# Patient Record
Sex: Male | Born: 2005 | Race: Black or African American | Hispanic: No | Marital: Single | Smoking: Never smoker
Health system: Southern US, Community
[De-identification: ages and names within clinical notes are randomized; demographics above are authoritative.]

## PROBLEM LIST (undated history)

## (undated) DIAGNOSIS — E559 Vitamin D deficiency, unspecified: Secondary | ICD-10-CM

## (undated) DIAGNOSIS — R6250 Unspecified lack of expected normal physiological development in childhood: Secondary | ICD-10-CM

## (undated) DIAGNOSIS — F909 Attention-deficit hyperactivity disorder, unspecified type: Secondary | ICD-10-CM

## (undated) DIAGNOSIS — R630 Anorexia: Secondary | ICD-10-CM

## (undated) DIAGNOSIS — U071 COVID-19: Secondary | ICD-10-CM

## (undated) DIAGNOSIS — E049 Nontoxic goiter, unspecified: Secondary | ICD-10-CM

## (undated) HISTORY — DX: Attention-deficit hyperactivity disorder, unspecified type: F90.9

## (undated) HISTORY — DX: Unspecified lack of expected normal physiological development in childhood: R62.50

## (undated) HISTORY — DX: Nontoxic goiter, unspecified: E04.9

## (undated) HISTORY — DX: Vitamin D deficiency, unspecified: E55.9

## (undated) HISTORY — PX: CIRCUMCISION: SHX1350

## (undated) HISTORY — DX: Anorexia: R63.0

## (undated) NOTE — *Deleted (*Deleted)
Integrated Behavioral Health Follow Up Visit  MRN: [id number] Name: Randy [name]  Number of Integrated Behavioral Health Clinician visits: 1/6 Session Start time: ***  Session End time: *** Total time: {IBH Total Time:21014050}  Type of Service: Integrated Behavioral Health- Individual/Family Interpretor:{yes [id number]} Interpretor Name and Language: ***  SUBJECTIVE: Randy Isola Priceis a 14 y.o.maleaccompanied by Mother Patient was referred byDr. Meredeth Ide as part of ADHD pathway based on Mom's concerns about poor academic performance. Patient reports the following symptoms/concerns:Patient reports that he is not doing well in school but is not worried about academics. Patient reports that he does want to pull up his one F that he has right now so that he can be eligible to play basketball. Patient reports that he plans to drop out of school at 5 (to which Mom responded that he will no longer be able to live at home if he drops out). The Patient's Mom also expressed concerns about the Patient's friend group being "gangsters" and feels that he engaging in negative behavior at school to impress his friends.  Duration of problem:several years; Severity of problem:mild  OBJECTIVE: Mood:Angry andlack of progress in schooland Affect: Appropriate Risk of harm to self or others:Patient reports some recent suicidal ideations but states he does not intent to act on them and has no plan.   LIFE CONTEXT: Family and Social:Patient lives with his Mom two sisters and younger brother. Mom reports that the Patient often expresses that he wants more attention from Mom. The Patient reports that Mom talks to him frequently about financial stressors and his Dad. School/Work:Patient is in 9th grade at [employer] and is currently getting D's in all classes except one of them (he has an F). The Patient plans to try out for basketball next week at his school and looks forward to  being on the team. Self-Care:Patient likes playing basketball and play station. Life Changes:None Reported  GOALS ADDRESSED: Patient will: 1. Reduce symptoms YN:WGNFAOZHY andhyperactivity and difficulty focusing 2. Increase knowledge and/or ability QM:VHQION skills and healthy habits 3. Demonstrate ability to:Increase adequate support systems for patient/family and Increase motivation to adhere to plan of care  INTERVENTIONS: Interventions utilized:Motivational Interviewing, Solution-Focused Strategies and Supportive Counseling Standardized Assessments completed:Not Needed ASSESSMENT: Patient currently experiencing ***.   Patient may benefit from ***.  PLAN: 4. Follow up with behavioral health clinician on : *** 5. Behavioral recommendations: *** 6. Referral(s): {IBH Referrals:[id number]} 7. "From scale of 1-10, how likely are you to follow plan?": ***  Katheran Awe, [facility]

---
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
## 2023-02-08 DIAGNOSIS — E063 Autoimmune thyroiditis: Secondary | ICD-10-CM | POA: Diagnosis not present

## 2023-02-08 LAB — TSH: TSH: 3.11 (ref 0.41–5.90)

## 2023-03-04 DIAGNOSIS — Z7689 Persons encountering health services in other specified circumstances: Secondary | ICD-10-CM | POA: Diagnosis not present

## 2023-03-17 ENCOUNTER — Encounter: Payer: Self-pay | Admitting: *Deleted

## 2023-05-27 DIAGNOSIS — M2550 Pain in unspecified joint: Secondary | ICD-10-CM | POA: Diagnosis not present

## 2023-05-27 DIAGNOSIS — M546 Pain in thoracic spine: Secondary | ICD-10-CM | POA: Diagnosis not present

## 2023-05-27 DIAGNOSIS — R768 Other specified abnormal immunological findings in serum: Secondary | ICD-10-CM | POA: Diagnosis not present

## 2023-05-27 DIAGNOSIS — M549 Dorsalgia, unspecified: Secondary | ICD-10-CM | POA: Diagnosis not present

## 2023-05-30 DIAGNOSIS — J029 Acute pharyngitis, unspecified: Secondary | ICD-10-CM | POA: Diagnosis not present

## 2023-05-30 DIAGNOSIS — R768 Other specified abnormal immunological findings in serum: Secondary | ICD-10-CM | POA: Diagnosis not present

## 2023-05-30 DIAGNOSIS — Z09 Encounter for follow-up examination after completed treatment for conditions other than malignant neoplasm: Secondary | ICD-10-CM | POA: Diagnosis not present

## 2023-07-26 DIAGNOSIS — M2550 Pain in unspecified joint: Secondary | ICD-10-CM | POA: Diagnosis not present

## 2023-07-26 DIAGNOSIS — M41124 Adolescent idiopathic scoliosis, thoracic region: Secondary | ICD-10-CM | POA: Diagnosis not present

## 2023-07-26 DIAGNOSIS — R768 Other specified abnormal immunological findings in serum: Secondary | ICD-10-CM | POA: Diagnosis not present

## 2023-08-01 DIAGNOSIS — R5383 Other fatigue: Secondary | ICD-10-CM | POA: Diagnosis not present

## 2023-08-01 DIAGNOSIS — E039 Hypothyroidism, unspecified: Secondary | ICD-10-CM | POA: Diagnosis not present

## 2023-08-01 DIAGNOSIS — M542 Cervicalgia: Secondary | ICD-10-CM | POA: Diagnosis not present

## 2023-08-01 DIAGNOSIS — E559 Vitamin D deficiency, unspecified: Secondary | ICD-10-CM | POA: Diagnosis not present

## 2023-08-11 DIAGNOSIS — M25562 Pain in left knee: Secondary | ICD-10-CM | POA: Diagnosis not present

## 2023-08-15 ENCOUNTER — Emergency Department (HOSPITAL_COMMUNITY)
Admission: EM | Admit: 2023-08-15 | Discharge: 2023-08-15 | Discharge status: Against Medical Advice | Payer: Medicaid Other | Source: Home / Self Care

## 2023-08-15 DIAGNOSIS — M549 Dorsalgia, unspecified: Secondary | ICD-10-CM | POA: Diagnosis not present

## 2023-08-26 NOTE — Patient Instructions (Signed)

## 2023-08-29 ENCOUNTER — Encounter: Payer: Self-pay | Admitting: Nurse Practitioner

## 2023-08-29 ENCOUNTER — Ambulatory Visit (INDEPENDENT_AMBULATORY_CARE_PROVIDER_SITE_OTHER): Payer: Medicaid Other | Admitting: Nurse Practitioner

## 2023-08-29 VITALS — BP 116/70 | HR 98 | Ht 67.0 in | Wt 123.0 lb

## 2023-08-29 DIAGNOSIS — E039 Hypothyroidism, unspecified: Secondary | ICD-10-CM | POA: Diagnosis not present

## 2023-08-29 NOTE — Progress Notes (Signed)
 Endocrinology Consult Note                                         08/29/2023, 1:54 PM  Subjective:   Subjective    Randy [name] is a 18 y.o.-year-old male patient being seen in consultation for hypothyroidism referred by Tommie Sams, DO.   Past Medical History:  Diagnosis Date   ADHD (attention deficit hyperactivity disorder)    COVID    Goiter    Physical growth delay    Poor appetite    Vitamin D deficiency disease     Past Surgical History:  Procedure Laterality Date   CIRCUMCISION     at birth    Social History   Socioeconomic History   Marital status: Single    Spouse name: Not on file   Number of children: Not on file   Years of education: Not on file   Highest education level: Not on file  Occupational History   Not on file  Tobacco Use   Smoking status: Never    Passive exposure: Yes   Smokeless tobacco: Never  Substance and Sexual Activity   Alcohol use: No    Alcohol/week: 0.0 standard drinks of alcohol   Drug use: No   Sexual activity: Never  Other Topics Concern   Not on file  Social History Narrative   Randy [name] lives with parents, 1 sister, 1 brother Jerilynn Som)       Social Drivers of Corporate investment banker Strain: Not on file  Food Insecurity: Unknown (07/26/2023)   Received from [facility]   Hunger Vital Sign    Worried About Running Out of Food in the Last Year: Not on file    Ran Out of Food in the Last Year: Never true  Transportation Needs: No Transportation Needs (07/26/2023)   Received from [facility] - Transportation    In the past 12 months, has lack of transportation kept you from medical appointments or from getting medications?: No    Lack of Transportation (Non-Medical): No  Physical Activity: Not on file  Stress: Not on file  Social Connections: Not on file    Family History  Problem Relation Age of  Onset   Mental illness Mother        Bipolar/depression   Bipolar disorder Mother    Mental illness Father    Mental illness Sister    Cancer Maternal Grandfather        Stomach Cancer   Thyroid disease Neg Hx     Outpatient Encounter Medications as of 08/29/2023  Medication Sig   ibuprofen (ADVIL) 200 MG tablet Take 200 mg by mouth as needed.   levothyroxine (SYNTHROID) 25 MCG tablet Take 25 mcg by mouth every morning.   No facility-administered encounter medications on file as of 08/29/2023.    ALLERGIES: No Known Allergies VACCINATION STATUS: Immunization History  Administered Date(s) Administered   DTaP 02/18/2004, 04/20/2004, 06/22/2004, 03/18/2005, 02/26/2008  HIB (PRP-OMP) 02/18/2004, 04/20/2004, 06/22/2004, 06/22/2005   HPV 9-valent 02/03/2021   Hepatitis A 06/22/2005, 12/20/2005   Hepatitis B 02/18/2004, 04/20/2004, 06/22/2004   Hepatitis B, PED/ADOLESCENT 27[birth year]Sep[birth year]2003   IPV 02/18/2004, 04/20/2004, 06/22/2004, 03/18/2005   MMR 12/17/2004, 02/25/2005   MenQuadfi_Meningococcal Groups ACYW Conjugate 12/30/2020   Meningococcal B, OMV 12/30/2020, 02/03/2021   Meningococcal Conjugate 02/14/2015   PFIZER(Purple Top)SARS-COV-2 Vaccination 11/26/2019, 12/17/2019   Pneumococcal Conjugate-13 02/18/2004, 04/20/2004, 06/22/2004, 06/22/2005   Tdap 02/14/2015   Varicella 12/17/2004, 02/26/2008     HPI   Randy [name]  is a patient with the above medical history. he was diagnosed with hypothyroidism at approximate age of 16 years, which required subsequent initiation of thyroid hormone replacement therapy. he was given low dose Levothyroxine 25 mcg back in June of 2024.  He reports initially he did have some relief in his symptoms but then they started to creep back in.  He notes he stopped the medication for approx 2 months, just restarted taking it about 2 weeks ago.  He notes when he stopped the medication, his joint aches got significantly worse.  I reviewed patient's thyroid  tests:  Lab Results  Component Value Date   TSH 3.11 02/08/2023   TSH 7.51 (A) 12/08/2022   TSH 3.384 07/28/2015   TSH 1.629 05/22/2014   TSH 4.163 02/05/2013   TSH 4.114 08/09/2012   TSH 4.647 10/19/2011   TSH 1.524 03/24/2011   FREET4 0.83 07/28/2015   FREET4 1.27 02/05/2013   FREET4 1.23 08/09/2012   FREET4 1.19 10/19/2011   FREET4 1.19 03/24/2011     Pt describes: - fatigue - heat intolerance - constipation - joint aches  Pt denies feeling nodules in neck, hoarseness, dysphagia/odynophagia, SOB with lying down.  he denies any known family history of thyroid disorders, No family history of thyroid cancer.  No history of radiation therapy to head or neck.  No recent use of iodine supplements.  Denies use of Biotin containing supplements.  I reviewed his chart and he also has a history of ADHD, migraines, vitamin D deficiency, scoliosis.   ROS:  Constitutional: no weight gain/loss, + fatigue, + subjective hyperthermia, no subjective hypothermia Eyes: no blurry vision, no xerophthalmia ENT: no sore throat, no nodules palpated in throat, no dysphagia/odynophagia, no hoarseness Cardiovascular: no chest pain, no SOB, no palpitations, no leg swelling Respiratory: no cough, no SOB Gastrointestinal: no nausea/vomiting/diarrhea, + constipation Musculoskeletal: + diffuse muscle/joint aches Skin: no rashes Neurological: no tremors, no numbness, no tingling, no dizziness Psychiatric: no depression, no anxiety   Objective:   Objective     BP 116/70 (BP Location: Left Arm, Patient Position: Sitting, Cuff Size: Large)   Pulse 98   Ht 5\' 7"  (1.702 m)   Wt 123 lb (55.8 kg)   BMI 19.26 kg/m  Wt Readings from Last 3 Encounters:  08/29/23 123 lb (55.8 kg) (5%, Z= -1.61)*  08/26/22 127 lb (57.6 kg) (12%, Z= -1.20)*  05/24/21 124 lb 9.6 oz (56.5 kg) (15%, Z= -1.02)*   * Growth percentiles are based on CDC (Boys, 2-20 Years) data.    BP Readings from Last 3 Encounters:   08/29/23 116/70  08/26/22 120/75  11/30/21 (!) 130/81     Constitutional:  Body mass index is 19.26 kg/m., not in acute distress, normal state of mind Eyes: PERRLA, EOMI, no exophthalmos ENT: moist mucous membranes, mild thyromegaly, no cervical lymphadenopathy Cardiovascular: normal precordial activity, RRR, + murmur, no rubs/gallops Respiratory:  adequate breathing efforts, no gross chest  deformity, Clear to auscultation bilaterally Gastrointestinal: abdomen soft, non-tender, no distension, bowel sounds present Musculoskeletal: no gross deformities, strength intact in all four extremities Skin: moist, warm, no rashes Neurological: + slight tremor with outstretched hands, deep tendon reflexes normal in BLE.   CMP ( most recent) CMP     Component Value Date/Time   NA 140 04/15/2021 1607   K 3.8 04/15/2021 1607   CL 105 04/15/2021 1607   CO2 28 04/15/2021 1607   GLUCOSE 83 04/15/2021 1607   GLUCOSE 89 09/26/2015 1143   BUN 11 04/15/2021 1607   CREATININE 0.91 04/15/2021 1607   CREATININE 0.57 05/22/2014 1558   CALCIUM 9.1 04/15/2021 1607   PROT 7.1 05/22/2014 1558   ALBUMIN 4.3 05/22/2014 1558   AST 26 05/22/2014 1558   ALT 11 05/22/2014 1558   ALKPHOS 340 05/22/2014 1558   BILITOT 0.4 05/22/2014 1558   GFRNONAA NOT CALCULATED 04/15/2021 1607     Diabetic Labs (most recent): No results found for: "HGBA1C", "MICROALBUR"   Lipid Panel ( most recent) Lipid Panel  No results found for: "CHOL", "TRIG", "HDL", "CHOLHDL", "VLDL", "LDLCALC", "LDLDIRECT", "LABVLDL"     Lab Results  Component Value Date   TSH 3.11 02/08/2023   TSH 7.51 (A) 12/08/2022   TSH 3.384 07/28/2015   TSH 1.629 05/22/2014   TSH 4.163 02/05/2013   TSH 4.114 08/09/2012   TSH 4.647 10/19/2011   TSH 1.524 03/24/2011   FREET4 0.83 07/28/2015   FREET4 1.27 02/05/2013   FREET4 1.23 08/09/2012   FREET4 1.19 10/19/2011   FREET4 1.19 03/24/2011      Assessment & Plan:   ASSESSMENT /  PLAN:  1. Hypothyroidism-unspecified   Patient with long-standing hypothyroidism, on levothyroxine therapy. On physical exam , patient  does not  have  gross goiter, thyroid nodules, or neck compression symptoms.  He is advised to continue Levothyroxine 25 mcg po daily before breakfast but to start taking it consistently for at least 6 weeks before we recheck labs to adjust dose.  - We discussed about correct intake of levothyroxine, at fasting, with water, separated by at least 30 minutes from breakfast, and separated by more than 4 hours from calcium, iron, multivitamins, acid reflux medications (PPIs). -Patient is made aware of the fact that thyroid hormone replacement is needed for life, dose to be adjusted by periodic monitoring of thyroid function tests.  - Will check thyroid tests before next visit: TSH, free T4, and thyroid antibodies to help classify dysfunction.  -Due to absence of clinical goiter, no need for thyroid ultrasound.  - Time spent with the patient: 45 minutes, of which >50% was spent in obtaining information about his symptoms, reviewing his previous labs, evaluations, and treatments, counseling him about his hypothyroidism, and developing a plan to confirm the diagnosis and long term treatment as necessary. Please refer to "Patient Self Inventory" in the Media tab for reviewed elements of pertinent patient history.  Shelva Majestic participated in the discussions, expressed understanding, and voiced agreement with the above plans.  All questions were answered to his satisfaction. he is encouraged to contact clinic should he have any questions or concerns prior to his return visit.   FOLLOW UP PLAN:  Return in about 6 weeks (around 10/10/2023) for Thyroid follow up, Previsit labs.  Ronny Bacon, [facility] [address] [place], [place] [address] Phone: [phone] Fax: [phone]  08/29/2023, 1:54 PM

## 2023-08-31 DIAGNOSIS — M329 Systemic lupus erythematosus, unspecified: Secondary | ICD-10-CM | POA: Diagnosis not present

## 2023-08-31 DIAGNOSIS — Z79899 Other long term (current) drug therapy: Secondary | ICD-10-CM | POA: Diagnosis not present

## 2023-09-19 DIAGNOSIS — J029 Acute pharyngitis, unspecified: Secondary | ICD-10-CM | POA: Diagnosis not present

## 2023-09-19 DIAGNOSIS — R030 Elevated blood-pressure reading, without diagnosis of hypertension: Secondary | ICD-10-CM | POA: Diagnosis not present

## 2023-10-12 LAB — TSH: TSH: 6.43 u[IU]/mL — ABNORMAL HIGH (ref 0.450–4.500)

## 2023-10-12 LAB — THYROGLOBULIN ANTIBODY: Thyroglobulin Antibody: <1 [IU]/mL (ref 0.0–0.9)

## 2023-10-12 LAB — T4, FREE: Free T4: 1.41 ng/dL (ref 0.93–1.60)

## 2023-10-12 LAB — THYROID PEROXIDASE ANTIBODY: Thyroperoxidase Ab SerPl-aCnc: 19 [IU]/mL (ref 0–26)

## 2023-10-19 NOTE — Patient Instructions (Signed)

## 2023-10-20 ENCOUNTER — Ambulatory Visit: Payer: Medicaid Other | Admitting: Nurse Practitioner

## 2023-10-20 ENCOUNTER — Encounter: Payer: Self-pay | Admitting: Nurse Practitioner

## 2023-10-20 VITALS — BP 116/80 | HR 99 | Ht 67.0 in | Wt 117.6 lb

## 2023-10-20 DIAGNOSIS — E039 Hypothyroidism, unspecified: Secondary | ICD-10-CM | POA: Diagnosis not present

## 2023-10-20 MED ORDER — LEVOTHYROXINE SODIUM 25 MCG PO TABS: 25.0000 ug | ORAL_TABLET | Freq: Every day | ORAL | 1 refills | Status: DC

## 2023-10-20 NOTE — Progress Notes (Signed)
 Endocrinology Follow Up Note                                         10/20/2023, 11:02 AM  Subjective:   Subjective    Randy [name] is a 18 y.o.-year-old male patient being seen in follow up after being seen in consultation for hypothyroidism referred by Cook, Jayce G, DO.   Past Medical History:  Diagnosis Date   ADHD (attention deficit hyperactivity disorder)    COVID    Goiter    Physical growth delay    Poor appetite    Vitamin D deficiency disease     Past Surgical History:  Procedure Laterality Date   CIRCUMCISION     at birth    Social History   Socioeconomic History   Marital status: Single    Spouse name: Not on file   Number of children: Not on file   Years of education: Not on file   Highest education level: Not on file  Occupational History   Not on file  Tobacco Use   Smoking status: Never    Passive exposure: Yes   Smokeless tobacco: Never  Substance and Sexual Activity   Alcohol use: No    Alcohol/week: 0.0 standard drinks of alcohol   Drug use: No   Sexual activity: Never  Other Topics Concern   Not on file  Social History Narrative   Amarius lives with parents, 1 sister, 1 brother Moishe Angel)       Social Drivers of Corporate investment banker Strain: Not on file  Food Insecurity: Unknown (07/26/2023)   Received from [facility]   Hunger Vital Sign    Worried About Running Out of Food in the Last Year: Not on file    Ran Out of Food in the Last Year: Never true  Transportation Needs: No Transportation Needs (07/26/2023)   Received from [facility] - Transportation    In the past 12 months, has lack of transportation kept you from medical appointments or from getting medications?: No    Lack of Transportation (Non-Medical): No  Physical Activity: Not on file  Stress: Not on file  Social Connections: Not on file    Family  History  Problem Relation Age of Onset   Mental illness Mother        Bipolar/depression   Bipolar disorder Mother    Mental illness Father    Mental illness Sister    Cancer Maternal Grandfather        Stomach Cancer   Thyroid disease Neg Hx     Outpatient Encounter Medications as of 10/20/2023  Medication Sig   hydroxychloroquine (PLAQUENIL) 200 MG tablet Take 200 mg by mouth daily.   ibuprofen (ADVIL) 200 MG tablet Take 200 mg by mouth as needed.   [DISCONTINUED] levothyroxine (SYNTHROID) 25 MCG tablet Take 25 mcg by mouth every morning.   levothyroxine (SYNTHROID) 25 MCG tablet Take 1 tablet (25 mcg total) by  mouth daily before breakfast.   No facility-administered encounter medications on file as of 10/20/2023.    ALLERGIES: No Known Allergies VACCINATION STATUS: Immunization History  Administered Date(s) Administered   DTaP 02/18/2004, 04/20/2004, 06/22/2004, 03/18/2005, 02/26/2008   HIB (PRP-OMP) 02/18/2004, 04/20/2004, 06/22/2004, 06/22/2005   HPV 9-valent 02/03/2021   Hepatitis A 06/22/2005, 12/20/2005   Hepatitis B 02/18/2004, 04/20/2004, 06/22/2004   Hepatitis B, PED/ADOLESCENT 11-17-03   IPV 02/18/2004, 04/20/2004, 06/22/2004, 03/18/2005   MMR 12/17/2004, 02/25/2005   MenQuadfi_Meningococcal Groups ACYW Conjugate 12/30/2020   Meningococcal B, OMV 12/30/2020, 02/03/2021   Meningococcal Conjugate 02/14/2015   PFIZER(Purple Top)SARS-COV-2 Vaccination 11/26/2019, 12/17/2019   Pneumococcal Conjugate-13 02/18/2004, 04/20/2004, 06/22/2004, 06/22/2005   Tdap 02/14/2015   Varicella 12/17/2004, 02/26/2008     HPI   Randy [name]  is a patient with the above medical history. he was diagnosed with hypothyroidism at approximate age of 16 years, which required subsequent initiation of thyroid hormone replacement therapy. he was given low dose Levothyroxine 25 mcg back in June of 2024.  He reports initially he did have some relief in his symptoms but then they started to  creep back in.  He notes he stopped the medication for approx 2 months, just restarted taking it about 2 weeks ago.  He notes when he stopped the medication, his joint aches got significantly worse.  I reviewed patient's thyroid tests:  Lab Results  Component Value Date   TSH 6.430 (H) 10/11/2023   TSH 3.11 02/08/2023   TSH 7.51 (A) 12/08/2022   TSH 3.384 07/28/2015   TSH 1.629 05/22/2014   TSH 4.163 02/05/2013   TSH 4.114 08/09/2012   TSH 4.647 10/19/2011   TSH 1.524 03/24/2011   FREET4 1.41 10/11/2023   FREET4 0.83 07/28/2015   FREET4 1.27 02/05/2013   FREET4 1.23 08/09/2012   FREET4 1.19 10/19/2011   FREET4 1.19 03/24/2011    Pt denies feeling nodules in neck, hoarseness, dysphagia/odynophagia, SOB with lying down.  he denies any known family history of thyroid disorders, No family history of thyroid cancer.  No history of radiation therapy to head or neck.  No recent use of iodine supplements.  Denies use of Biotin containing supplements.  I reviewed his chart and he also has a history of ADHD, migraines, vitamin D deficiency, scoliosis.   ROS:  Constitutional: no weight gain/loss, + fatigue-improving, + subjective hyperthermia-resolved, no subjective hypothermia Eyes: no blurry vision, no xerophthalmia ENT: no sore throat, no nodules palpated in throat, no dysphagia/odynophagia, no hoarseness Cardiovascular: no chest pain, no SOB, no palpitations, no leg swelling Respiratory: no cough, no SOB Gastrointestinal: no nausea/vomiting/diarrhea, + constipation-improving Musculoskeletal: + diffuse muscle/joint aches-improving Skin: no rashes Neurological: no tremors, no numbness, no tingling, no dizziness Psychiatric: no depression, no anxiety   Objective:   Objective     BP 116/80 (BP Location: Left Arm, Patient Position: Sitting, Cuff Size: Large)   Pulse 99   Ht 5\' 7"  (1.702 m)   Wt 117 lb 9.6 oz (53.3 kg)   BMI 18.42 kg/m  Wt Readings from Last 3 Encounters:   10/20/23 117 lb 9.6 oz (53.3 kg) (2%, Z= -1.97)*  08/29/23 123 lb (55.8 kg) (5%, Z= -1.61)*  08/26/22 127 lb (57.6 kg) (12%, Z= -1.20)*   * Growth percentiles are based on CDC (Boys, 2-20 Years) data.    BP Readings from Last 3 Encounters:  10/20/23 116/80  08/29/23 116/70  08/26/22 120/75      Physical Exam- Limited  Constitutional:  Body mass  index is 18.42 kg/m. , not in acute distress, normal state of mind Eyes:  EOMI, no exophthalmos Musculoskeletal: no gross deformities, strength intact in all four extremities, no gross restriction of joint movements Skin:  no rashes, no hyperemia Neurological: + slight tremor with outstretched hands   CMP ( most recent) CMP     Component Value Date/Time   NA 140 04/15/2021 1607   K 3.8 04/15/2021 1607   CL 105 04/15/2021 1607   CO2 28 04/15/2021 1607   GLUCOSE 83 04/15/2021 1607   GLUCOSE 89 09/26/2015 1143   BUN 11 04/15/2021 1607   CREATININE 0.91 04/15/2021 1607   CREATININE 0.57 05/22/2014 1558   CALCIUM 9.1 04/15/2021 1607   PROT 7.1 05/22/2014 1558   ALBUMIN 4.3 05/22/2014 1558   AST 26 05/22/2014 1558   ALT 11 05/22/2014 1558   ALKPHOS 340 05/22/2014 1558   BILITOT 0.4 05/22/2014 1558   GFRNONAA NOT CALCULATED 04/15/2021 1607     Diabetic Labs (most recent): No results found for: "HGBA1C", "MICROALBUR"   Lipid Panel ( most recent) Lipid Panel  No results found for: "CHOL", "TRIG", "HDL", "CHOLHDL", "VLDL", "LDLCALC", "LDLDIRECT", "LABVLDL"     Lab Results  Component Value Date   TSH 6.430 (H) 10/11/2023   TSH 3.11 02/08/2023   TSH 7.51 (A) 12/08/2022   TSH 3.384 07/28/2015   TSH 1.629 05/22/2014   TSH 4.163 02/05/2013   TSH 4.114 08/09/2012   TSH 4.647 10/19/2011   TSH 1.524 03/24/2011   FREET4 1.41 10/11/2023   FREET4 0.83 07/28/2015   FREET4 1.27 02/05/2013   FREET4 1.23 08/09/2012   FREET4 1.19 10/19/2011   FREET4 1.19 03/24/2011      Assessment & Plan:   ASSESSMENT / PLAN:  1.  Hypothyroidism-acquired  Patient with long-standing hypothyroidism, on levothyroxine therapy. On physical exam, patient does not have gross goiter, thyroid nodules, or neck compression symptoms.  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  He is advised to continue Levothyroxine 25 mcg po daily before breakfast.  TSH is not quite at goal but his Free T4 is on the mid to upper end of normal, for which he may not tolerate a dosage increase.  - We discussed about correct intake of levothyroxine, at fasting, with water, separated by at least 30 minutes from breakfast, and separated by more than 4 hours from calcium, iron, multivitamins, acid reflux medications (PPIs). -Patient is made aware of the fact that thyroid hormone replacement is needed for life, dose to be adjusted by periodic monitoring of thyroid function tests.  Will recheck TFTs prior to next visit and adjust dose accordingly.   I spent  24  minutes in the care of the patient today including review of labs from Thyroid Function, CMP, and other relevant labs ; imaging/biopsy records (current and previous including abstractions from other facilities); face-to-face time discussing  his lab results and symptoms, medications doses, his options of short and long term treatment based on the latest standards of care / guidelines;   and documenting the encounter.  Randy [name]  participated in the discussions, expressed understanding, and voiced agreement with the above plans.  All questions were answered to his satisfaction. he is encouraged to contact clinic should he have any questions or concerns prior to his return visit.   FOLLOW UP PLAN:  Return in about 3 months (around 01/19/2024) for Thyroid follow up, Previsit  labs.  Randy [name], [facility] [address] [place], [place] [address] Phone: [phone] Fax: [phone]  10/20/2023, 11:02 AM

## 2023-10-26 DIAGNOSIS — K0262 Dental caries on smooth surface penetrating into dentin: Secondary | ICD-10-CM | POA: Diagnosis not present

## 2023-10-26 DIAGNOSIS — Z0121 Encounter for dental examination and cleaning with abnormal findings: Secondary | ICD-10-CM | POA: Diagnosis not present

## 2023-12-28 DIAGNOSIS — M25561 Pain in right knee: Secondary | ICD-10-CM | POA: Diagnosis not present

## 2024-01-17 DIAGNOSIS — E039 Hypothyroidism, unspecified: Secondary | ICD-10-CM | POA: Diagnosis not present

## 2024-01-18 DIAGNOSIS — Z79899 Other long term (current) drug therapy: Secondary | ICD-10-CM | POA: Diagnosis not present

## 2024-01-18 DIAGNOSIS — M329 Systemic lupus erythematosus, unspecified: Secondary | ICD-10-CM | POA: Diagnosis not present

## 2024-01-18 LAB — TSH: TSH: 5.53 u[IU]/mL — ABNORMAL HIGH (ref 0.450–4.500)

## 2024-01-18 LAB — T4, FREE: Free T4: 1.32 ng/dL (ref 0.82–1.77)

## 2024-01-19 ENCOUNTER — Encounter: Payer: Self-pay | Admitting: Internal Medicine

## 2024-01-19 ENCOUNTER — Ambulatory Visit: Attending: Internal Medicine | Admitting: Internal Medicine

## 2024-01-19 VITALS — BP 122/84 | HR 73 | Ht 67.0 in | Wt 119.0 lb

## 2024-01-19 DIAGNOSIS — R011 Cardiac murmur, unspecified: Secondary | ICD-10-CM | POA: Diagnosis not present

## 2024-01-19 NOTE — Progress Notes (Signed)
 Cardiology Office Note   Date:  01/19/2024   ID:  Randy [name], DOB 06-Oct-2005, MRN [id number]  PCP:  Bluford Jacqulyn MATSU, DO  Cardiologist:   Vina Gull, MD    Pt referred for evaluation of a heart murmur   History of Present Illness: Randy [name] is a 18 y.o. male with a history of lupus  Followed at [facility] Recently seen in clinic and a heart murmur was appreciated  Referred to cardiology   The pt denies CP   Breathing is good   He is very active   Runs  He denies palpitations   No dizziness   Current Meds  Medication Sig   hydroxychloroquine (PLAQUENIL) 200 MG tablet Take 200 mg by mouth daily.   ibuprofen  (ADVIL ) 200 MG tablet Take 200 mg by mouth as needed.   levothyroxine  (SYNTHROID ) 25 MCG tablet Take 1 tablet (25 mcg total) by mouth daily before breakfast.     Past Medical History:  Diagnosis Date   ADHD (attention deficit hyperactivity disorder)    COVID    Goiter    Physical growth delay    Poor appetite    Vitamin D  deficiency disease     Past Surgical History:  Procedure Laterality Date   CIRCUMCISION     at birth     Social History:  The patient  reports that he has never smoked. He has been exposed to tobacco smoke. He has never used smokeless tobacco. He reports that he does not drink alcohol and does not use drugs.   Family History:  The patient's family history includes Bipolar disorder in his mother; Cancer in his maternal grandfather; Mental illness in his father, mother, and sister.    ROS:  Please see the history of present illness. All other systems are reviewed and  Negative to the above problem except as noted.    PHYSICAL EXAM: VS:  BP 122/84 (BP Location: Right Arm, Patient Position: Sitting)   Pulse 73   Ht 5' 7 (1.702 m)   Wt 119 lb (54 kg)   SpO2 98%   BMI 18.64 kg/m   GEN: Well nourished, well developed, in no acute distress  HEENT: normal  Neck: no JVD, carotid bruits Cardiac: RRR;  Gr I/VI systolic murmur base that went  away with sitting   ? Grade I/VI systlic murmur apex    Respiratory:  clear to auscultation  GI: soft, nontender, nondistended, No hepatomegaly  Ext  No LE edema  2+ PT pulses  Skin: warm and dry, no rash Neuro:  Strength and sensation are intact Psych: euthymic mood, full affect   EKG:  EKG is ordered today.  SR   ST changes consistent with early repolarization   Lipid Panel No results found for: CHOL, TRIG, HDL, CHOLHDL, VLDL, LDLCALC, LDLDIRECT    Wt Readings from Last 3 Encounters:  01/19/24 119 lb (54 kg)  10/20/23 117 lb 9.6 oz (53.3 kg) (2%, Z= -1.97)*  08/29/23 123 lb (55.8 kg) (5%, Z= -1.61)*   * Growth percentiles are based on CDC (Boys, 2-20 Years) data.      ASSESSMENT AND PLAN:  1  Murmur  Pt has a very soft systolic murmur at the base of heart that disappears with sitting    Consistent with Stills type murmur   He is very thin, young     No other pathologic murmurs appreciated      2  HCM   Reviewed diet   Avoid  sugar sweetened beverages, ultraprocessed foods   Recomm:  Veggies, lean proteins, water, unsweetened beverages   Will be available as needed    Current medicines are reviewed at length with the patient today.  The patient does not have concerns regarding medicines.  Signed, Vina Gull, MD  01/19/2024 9:31 AM    [facility] [address] [place], [place], [place]  [address] Phone: [phone]; Fax: [phone]

## 2024-01-19 NOTE — Patient Instructions (Signed)
Medication Instructions:  Continue all current medications.  Labwork: none  Testing/Procedures: none  Follow-Up: As needed.    Any Other Special Instructions Will Be Listed Below (If Applicable).  If you need a refill on your cardiac medications before your next appointment, please call your pharmacy.  

## 2024-01-20 DIAGNOSIS — M25562 Pain in left knee: Secondary | ICD-10-CM | POA: Diagnosis not present

## 2024-01-24 NOTE — Patient Instructions (Signed)

## 2024-01-25 ENCOUNTER — Encounter: Payer: Self-pay | Admitting: Nurse Practitioner

## 2024-01-25 ENCOUNTER — Ambulatory Visit (INDEPENDENT_AMBULATORY_CARE_PROVIDER_SITE_OTHER): Admitting: Nurse Practitioner

## 2024-01-25 VITALS — BP 102/62 | HR 90 | Ht 67.0 in | Wt 117.8 lb

## 2024-01-25 DIAGNOSIS — E039 Hypothyroidism, unspecified: Secondary | ICD-10-CM

## 2024-01-25 MED ORDER — LEVOTHYROXINE SODIUM 25 MCG PO TABS: 25.0000 ug | ORAL_TABLET | Freq: Every day | ORAL | 1 refills | Status: AC

## 2024-01-25 NOTE — Progress Notes (Signed)
 Endocrinology Follow Up Note                                         01/25/2024, 11:17 AM  Subjective:   Subjective    Randy [name] is a 18 y.o.-year-old male patient being seen in follow up after being seen in consultation for hypothyroidism referred by [name], Randy G, DO.   Past Medical History:  Diagnosis Date   ADHD (attention deficit hyperactivity disorder)    COVID    Goiter    Physical growth delay    Poor appetite    Vitamin D  deficiency disease     Past Surgical History:  Procedure Laterality Date   CIRCUMCISION     at birth    Social History   Socioeconomic History   Marital status: Single    Spouse name: Not on file   Number of children: Not on file   Years of education: Not on file   Highest education level: Not on file  Occupational History   Not on file  Tobacco Use   Smoking status: Never    Passive exposure: Yes   Smokeless tobacco: Never  Substance and Sexual Activity   Alcohol use: No    Alcohol/week: 0.0 standard drinks of alcohol   Drug use: No   Sexual activity: Never  Other Topics Concern   Not on file  Social History Narrative   Randy [name] lives with parents, 1 sister, 1 brother Randy [name])       Social Drivers of Corporate investment banker Strain: Not on file  Food Insecurity: Unknown (07/26/2023)   Received from [facility]   Hunger Vital Sign    Worried About Running Out of Food in the Last Year: Not on file    Within the past 12 months, the food you bought just didn't last and you didn't have money to get more.: Never true  Transportation Needs: No Transportation Needs (07/26/2023)   Received from [facility]    In the past 12 months, has lack of transportation kept you from medical appointments or from getting medications?: No    Lack of Transportation (Non-Medical): No  Physical Activity: Not on file   Stress: Not on file  Social Connections: Not on file    Family History  Problem Relation Age of Onset   Mental illness Mother        Bipolar/depression   Bipolar disorder Mother    Mental illness Father    Mental illness Sister    Cancer Maternal Grandfather        Stomach Cancer   Thyroid  disease Neg Hx     Outpatient Encounter Medications as of 01/25/2024  Medication Sig   hydroxychloroquine (PLAQUENIL) 200 MG tablet Take 200 mg by mouth daily.   ibuprofen  (ADVIL ) 200 MG tablet Take 200 mg by mouth as needed.   [DISCONTINUED] levothyroxine  (SYNTHROID ) 25 MCG tablet Take 1 tablet (25 mcg total) by mouth daily  before breakfast.   levothyroxine  (SYNTHROID ) 25 MCG tablet Take 1 tablet (25 mcg total) by mouth daily before breakfast.   No facility-administered encounter medications on file as of 01/25/2024.    ALLERGIES: No Known Allergies VACCINATION STATUS: Immunization History  Administered Date(s) Administered   DTaP 02/18/2004, 04/20/2004, 06/22/2004, 03/18/2005, 02/26/2008   HIB (PRP-OMP) 02/18/2004, 04/20/2004, 06/22/2004, 06/22/2005   HPV 9-valent 02/03/2021   Hepatitis A 06/22/2005, 12/20/2005   Hepatitis B 02/18/2004, 04/20/2004, 06/22/2004   Hepatitis B, PED/ADOLESCENT 01-06-2004   IPV 02/18/2004, 04/20/2004, 06/22/2004, 03/18/2005   MMR 12/17/2004, 02/25/2005   MenQuadfi_Meningococcal Groups ACYW Conjugate 12/30/2020   Meningococcal B, OMV 12/30/2020, 02/03/2021   Meningococcal Conjugate 02/14/2015   PFIZER(Purple Top)SARS-COV-2 Vaccination 11/26/2019, 12/17/2019   Pneumococcal Conjugate-13 02/18/2004, 04/20/2004, 06/22/2004, 06/22/2005   Tdap 02/14/2015   Varicella 12/17/2004, 02/26/2008     HPI   Randy [name]  is a patient with the above medical history. he was diagnosed with hypothyroidism at approximate age of 16 years, which required subsequent initiation of thyroid  hormone replacement therapy. he was given low dose Levothyroxine  25 mcg back in June  of 2024.  He reports initially he did have some relief in his symptoms but then they started to creep back in.  He notes he stopped the medication for approx 2 months, just restarted taking it about 2 weeks ago.  He notes when he stopped the medication, his joint aches got significantly worse.  I reviewed patient's thyroid  tests:  Lab Results  Component Value Date   TSH 5.530 (H) 01/17/2024   TSH 6.430 (H) 10/11/2023   TSH 3.11 02/08/2023   TSH 7.51 (A) 12/08/2022   TSH 3.384 07/28/2015   TSH 1.629 05/22/2014   TSH 4.163 02/05/2013   TSH 4.114 08/09/2012   TSH 4.647 10/19/2011   TSH 1.524 03/24/2011   FREET4 1.32 01/17/2024   FREET4 1.41 10/11/2023   FREET4 0.83 07/28/2015   FREET4 1.27 02/05/2013   FREET4 1.23 08/09/2012   FREET4 1.19 10/19/2011   FREET4 1.19 03/24/2011    Pt denies feeling nodules in neck, hoarseness, dysphagia/odynophagia, SOB with lying down.  he denies any known family history of thyroid  disorders, No family history of thyroid  cancer.  No history of radiation therapy to head or neck.  No recent use of iodine supplements.  Denies use of Biotin containing supplements.  I reviewed his chart and he also has a history of ADHD, migraines, vitamin D  deficiency, scoliosis.  Review of systems  Constitutional: + Minimally fluctuating body weight,  current Body mass index is 18.45 kg/m. , no fatigue, no subjective hyperthermia, no subjective hypothermia Eyes: no blurry vision, no xerophthalmia ENT: no sore throat, no nodules palpated in throat, no dysphagia/odynophagia, no hoarseness Cardiovascular: no chest pain, no shortness of breath, no palpitations, no leg swelling Respiratory: no cough, no shortness of breath Gastrointestinal: no nausea/vomiting/diarrhea Musculoskeletal: + chronic diffuse muscle/joint aches-somewhat improved Skin: no rashes, no hyperemia Neurological: no tremors, no numbness, no tingling, no dizziness Psychiatric: no depression, no  anxiety   Objective:   Objective     BP 102/62 (BP Location: Right Arm, Patient Position: Sitting, Cuff Size: Large)   Pulse 90   Ht 5' 7 (1.702 m)   Wt 117 lb 12.8 oz (53.4 kg)   BMI 18.45 kg/m  Wt Readings from Last 3 Encounters:  01/25/24 117 lb 12.8 oz (53.4 kg)  01/19/24 119 lb (54 kg)  10/20/23 117 lb 9.6 oz (53.3 kg) (2%, Z= -1.97)*   *  Growth percentiles are based on CDC (Boys, 2-20 Years) data.    BP Readings from Last 3 Encounters:  01/25/24 102/62  01/19/24 122/84  10/20/23 116/80       Physical Exam- Limited  Constitutional:  Body mass index is 18.45 kg/m. , not in acute distress, normal state of mind Eyes:  EOMI, no exophthalmos Musculoskeletal: no gross deformities, strength intact in all four extremities, no gross restriction of joint movements Skin:  no rashes, no hyperemia Neurological: no tremor with outstretched hands   CMP ( most recent) CMP     Component Value Date/Time   NA 140 04/15/2021 1607   K 3.8 04/15/2021 1607   CL 105 04/15/2021 1607   CO2 28 04/15/2021 1607   GLUCOSE 83 04/15/2021 1607   GLUCOSE 89 09/26/2015 1143   BUN 11 04/15/2021 1607   CREATININE 0.91 04/15/2021 1607   CREATININE 0.57 05/22/2014 1558   CALCIUM 9.1 04/15/2021 1607   PROT 7.1 05/22/2014 1558   ALBUMIN 4.3 05/22/2014 1558   AST 26 05/22/2014 1558   ALT 11 05/22/2014 1558   ALKPHOS 340 05/22/2014 1558   BILITOT 0.4 05/22/2014 1558   GFRNONAA NOT CALCULATED 04/15/2021 1607     Diabetic Labs (most recent): No results found for: HGBA1C, MICROALBUR   Lipid Panel ( most recent) Lipid Panel  No results found for: CHOL, TRIG, HDL, CHOLHDL, VLDL, LDLCALC, LDLDIRECT, LABVLDL     Lab Results  Component Value Date   TSH 5.530 (H) 01/17/2024   TSH 6.430 (H) 10/11/2023   TSH 3.11 02/08/2023   TSH 7.51 (A) 12/08/2022   TSH 3.384 07/28/2015   TSH 1.629 05/22/2014   TSH 4.163 02/05/2013   TSH 4.114 08/09/2012   TSH 4.647 10/19/2011    TSH 1.524 03/24/2011   FREET4 1.32 01/17/2024   FREET4 1.41 10/11/2023   FREET4 0.83 07/28/2015   FREET4 1.27 02/05/2013   FREET4 1.23 08/09/2012   FREET4 1.19 10/19/2011   FREET4 1.19 03/24/2011     Latest Reference Range & Units 07/28/15 11:03 12/08/22 00:00 02/08/23 00:00 10/11/23 08:35 01/17/24 08:42  TSH 0.450 - 4.500 uIU/mL 3.384 7.51 ! (E) 3.11 (E) 6.430 (H) 5.530 (H)  T4,Free(Direct) 0.82 - 1.77 ng/dL 9.16   8.58 8.67  Thyroperoxidase Ab SerPl-aCnc 0 - 26 IU/mL    19   Thyroglobulin Antibody 0.0 - 0.9 IU/mL    <1.0   !: Data is abnormal (H): Data is abnormally high (E): External lab result  Assessment & Plan:   ASSESSMENT / PLAN:  1. Hypothyroidism-acquired  Patient with long-standing hypothyroidism, on levothyroxine  therapy. On physical exam, patient does not have gross goiter, thyroid  nodules, or neck compression symptoms.  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  He admits he has been experimenting with his medication, right now, taking it every other day to see if it helped to improve his pain overall (which he notes it does).  He is advised to continue Levothyroxine  25 mcg po every other day before breakfast.  TSH is not quite at goal (likely due to not taking it daily) but his Free T4 is on the mid to upper end of normal, for which he may not tolerate a dosage increase.  We did talk about potentially cutting it down to 12.5 mcg po daily for consistency purposes, which he is willing to consider.  - We discussed about correct intake of levothyroxine , at fasting,  with water, separated by at least 30 minutes from breakfast, and separated by more than 4 hours from calcium, iron, multivitamins, acid reflux medications (PPIs). -Patient is made aware of the fact that thyroid  hormone replacement is needed for life, dose to be adjusted by periodic  monitoring of thyroid  function tests.  Will recheck TFTs prior to next visit and adjust dose accordingly.   I spent  12  minutes in the care of the patient today including review of labs from Thyroid  Function, CMP, and other relevant labs ; imaging/biopsy records (current and previous including abstractions from other facilities); face-to-face time discussing  his lab results and symptoms, medications doses, his options of short and long term treatment based on the latest standards of care / guidelines;   and documenting the encounter.  Caron LITTIE Domino  participated in the discussions, expressed understanding, and voiced agreement with the above plans.  All questions were answered to his satisfaction. he is encouraged to contact clinic should he have any questions or concerns prior to his return visit.   FOLLOW UP PLAN:  Return in about 6 months (around 07/27/2024) for Thyroid  follow up, Previsit labs.  Benton Rio, [facility] [address] [place], [place] [address] Phone: [phone] Fax: [phone]  01/25/2024, 11:17 AM

## 2024-02-02 DIAGNOSIS — K0262 Dental caries on smooth surface penetrating into dentin: Secondary | ICD-10-CM | POA: Diagnosis not present

## 2024-02-08 ENCOUNTER — Ambulatory Visit (HOSPITAL_COMMUNITY): Attending: Nurse Practitioner

## 2024-02-08 ENCOUNTER — Other Ambulatory Visit: Payer: Self-pay

## 2024-02-08 DIAGNOSIS — M25562 Pain in left knee: Secondary | ICD-10-CM | POA: Insufficient documentation

## 2024-02-08 DIAGNOSIS — R262 Difficulty in walking, not elsewhere classified: Secondary | ICD-10-CM | POA: Insufficient documentation

## 2024-02-08 DIAGNOSIS — R2689 Other abnormalities of gait and mobility: Secondary | ICD-10-CM | POA: Insufficient documentation

## 2024-02-08 DIAGNOSIS — M25561 Pain in right knee: Secondary | ICD-10-CM | POA: Insufficient documentation

## 2024-02-08 DIAGNOSIS — M6281 Muscle weakness (generalized): Secondary | ICD-10-CM | POA: Diagnosis not present
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
## 2024-02-10 ENCOUNTER — Encounter (HOSPITAL_COMMUNITY): Payer: Self-pay

## 2024-02-10 ENCOUNTER — Ambulatory Visit (HOSPITAL_COMMUNITY)

## 2024-02-10 DIAGNOSIS — R2689 Other abnormalities of gait and mobility: Secondary | ICD-10-CM | POA: Diagnosis not present

## 2024-02-10 DIAGNOSIS — R262 Difficulty in walking, not elsewhere classified: Secondary | ICD-10-CM | POA: Diagnosis not present

## 2024-02-10 DIAGNOSIS — M25562 Pain in left knee: Secondary | ICD-10-CM | POA: Diagnosis not present

## 2024-02-10 DIAGNOSIS — M6281 Muscle weakness (generalized): Secondary | ICD-10-CM | POA: Diagnosis not present

## 2024-02-10 DIAGNOSIS — M25561 Pain in right knee: Secondary | ICD-10-CM | POA: Diagnosis not present
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
## 2024-02-16 ENCOUNTER — Ambulatory Visit (HOSPITAL_COMMUNITY): Admitting: Physical Therapy

## 2024-02-16 DIAGNOSIS — R262 Difficulty in walking, not elsewhere classified: Secondary | ICD-10-CM

## 2024-02-16 DIAGNOSIS — R2689 Other abnormalities of gait and mobility: Secondary | ICD-10-CM | POA: Diagnosis not present

## 2024-02-16 DIAGNOSIS — M25562 Pain in left knee: Secondary | ICD-10-CM

## 2024-02-16 DIAGNOSIS — M25561 Pain in right knee: Secondary | ICD-10-CM | POA: Diagnosis not present

## 2024-02-16 DIAGNOSIS — E039 Hypothyroidism, unspecified: Secondary | ICD-10-CM | POA: Diagnosis not present

## 2024-02-16 DIAGNOSIS — M6281 Muscle weakness (generalized): Secondary | ICD-10-CM

## 2024-02-16 DIAGNOSIS — M329 Systemic lupus erythematosus, unspecified: Secondary | ICD-10-CM | POA: Diagnosis not present
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
## 2024-02-27 ENCOUNTER — Encounter (HOSPITAL_COMMUNITY)

## 2024-02-27 ENCOUNTER — Telehealth (HOSPITAL_COMMUNITY): Payer: Self-pay

## 2024-02-27 NOTE — Telephone Encounter (Signed)
 No show #1-Left message regarding missed appointment today and reminder of upcoming appointment on Wed; 8/27 at 11:00.    10:47 AM, 02/27/24 Monnie Anspach Small Younes Degeorge MPT [facility] [place] [phone] [phone]

## 2024-02-29 ENCOUNTER — Ambulatory Visit (HOSPITAL_COMMUNITY): Admitting: Physical Therapy

## 2024-02-29 DIAGNOSIS — R2689 Other abnormalities of gait and mobility: Secondary | ICD-10-CM

## 2024-02-29 DIAGNOSIS — M25561 Pain in right knee: Secondary | ICD-10-CM | POA: Diagnosis not present

## 2024-02-29 DIAGNOSIS — R262 Difficulty in walking, not elsewhere classified: Secondary | ICD-10-CM | POA: Diagnosis not present

## 2024-02-29 DIAGNOSIS — M6281 Muscle weakness (generalized): Secondary | ICD-10-CM

## 2024-02-29 DIAGNOSIS — M25562 Pain in left knee: Secondary | ICD-10-CM | POA: Diagnosis not present
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
## 2024-03-07 ENCOUNTER — Ambulatory Visit (HOSPITAL_COMMUNITY): Attending: Nurse Practitioner

## 2024-03-07 ENCOUNTER — Encounter (HOSPITAL_COMMUNITY): Payer: Self-pay

## 2024-03-07 DIAGNOSIS — R2689 Other abnormalities of gait and mobility: Secondary | ICD-10-CM | POA: Insufficient documentation

## 2024-03-07 DIAGNOSIS — M6281 Muscle weakness (generalized): Secondary | ICD-10-CM | POA: Diagnosis present

## 2024-03-07 DIAGNOSIS — R262 Difficulty in walking, not elsewhere classified: Secondary | ICD-10-CM | POA: Diagnosis present

## 2024-03-07 DIAGNOSIS — M25562 Pain in left knee: Secondary | ICD-10-CM | POA: Diagnosis present
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
## 2024-03-09 ENCOUNTER — Telehealth (HOSPITAL_COMMUNITY): Payer: Self-pay

## 2024-03-09 ENCOUNTER — Ambulatory Visit (HOSPITAL_COMMUNITY)

## 2024-03-09 NOTE — Telephone Encounter (Signed)
 No show #2; left message on voicemail regarding no show for appointment today and reminder of upcoming appointment on Tues 9/9. Also left reminder of no show policy will discharge after 3 no show appointments.  1:22 PM, 03/09/24 Jere Bostrom Small Hussein Macdougal MPT [facility] [place] [phone]

## 2024-03-13 ENCOUNTER — Ambulatory Visit (HOSPITAL_COMMUNITY): Admitting: Physical Therapy

## 2024-03-13 DIAGNOSIS — M6281 Muscle weakness (generalized): Secondary | ICD-10-CM

## 2024-03-13 DIAGNOSIS — R2689 Other abnormalities of gait and mobility: Secondary | ICD-10-CM

## 2024-03-13 DIAGNOSIS — M25562 Pain in left knee: Secondary | ICD-10-CM

## 2024-03-13 DIAGNOSIS — R262 Difficulty in walking, not elsewhere classified: Secondary | ICD-10-CM
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
## 2024-03-16 ENCOUNTER — Encounter (HOSPITAL_COMMUNITY)

## 2024-03-21 ENCOUNTER — Ambulatory Visit (HOSPITAL_COMMUNITY)

## 2024-03-21 ENCOUNTER — Encounter (HOSPITAL_COMMUNITY): Payer: Self-pay

## 2024-03-21 DIAGNOSIS — R262 Difficulty in walking, not elsewhere classified: Secondary | ICD-10-CM

## 2024-03-21 DIAGNOSIS — R2689 Other abnormalities of gait and mobility: Secondary | ICD-10-CM

## 2024-03-21 DIAGNOSIS — M6281 Muscle weakness (generalized): Secondary | ICD-10-CM

## 2024-03-21 DIAGNOSIS — M25562 Pain in left knee: Secondary | ICD-10-CM
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
## 2024-03-23 ENCOUNTER — Encounter: Payer: Self-pay | Admitting: *Deleted

## 2024-03-27 DIAGNOSIS — S80822A Blister (nonthermal), left lower leg, initial encounter: Secondary | ICD-10-CM | POA: Diagnosis not present

## 2024-03-29 ENCOUNTER — Encounter (HOSPITAL_COMMUNITY): Payer: Self-pay

## 2024-03-29 ENCOUNTER — Encounter (HOSPITAL_COMMUNITY)
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
## 2024-03-29 NOTE — Therapy (Signed)
 [facility] Outpatient Rehabilitation at [facility] [address] [place], [place], [address] Phone: [phone]   Fax:  [phone]  Patient Details  Name: Randy [name] MRN: [id number] Date of Birth: 2005/07/19 Referring Provider:  No ref. provider found  Encounter Date: 03/29/2024  Pt no show for appointment and called regarding no show. Left VM informing of no show as well as of no show policy.  Lamarr LITTIE Citrin, PT 03/29/2024, 11:17 AM  [facility] Outpatient Rehabilitation at [facility] [address] [place], [place], [address] Phone: [phone]   Fax:  [phone]

## 2024-04-04 ENCOUNTER — Ambulatory Visit (HOSPITAL_COMMUNITY): Attending: Nurse Practitioner | Admitting: Physical Therapy

## 2024-04-04 DIAGNOSIS — R2689 Other abnormalities of gait and mobility: Secondary | ICD-10-CM | POA: Diagnosis not present

## 2024-04-04 DIAGNOSIS — R262 Difficulty in walking, not elsewhere classified: Secondary | ICD-10-CM | POA: Diagnosis not present

## 2024-04-04 DIAGNOSIS — M25562 Pain in left knee: Secondary | ICD-10-CM | POA: Diagnosis not present

## 2024-04-04 DIAGNOSIS — M6281 Muscle weakness (generalized): Secondary | ICD-10-CM | POA: Diagnosis not present
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
## 2024-04-11 ENCOUNTER — Encounter (HOSPITAL_COMMUNITY)

## 2024-04-18 ENCOUNTER — Encounter (HOSPITAL_COMMUNITY)

## 2024-05-24 DIAGNOSIS — M329 Systemic lupus erythematosus, unspecified: Secondary | ICD-10-CM | POA: Diagnosis not present

## 2024-05-24 DIAGNOSIS — Z79899 Other long term (current) drug therapy: Secondary | ICD-10-CM | POA: Diagnosis not present

## 2024-07-18 LAB — TSH: TSH: 1.94 u[IU]/mL (ref 0.450–4.500)

## 2024-07-18 LAB — T4, FREE: Free T4: 1.15 ng/dL (ref 0.82–1.77)

## 2024-07-18 LAB — T3, FREE: T3, Free: 3.3 pg/mL (ref 2.0–4.4)

## 2024-07-27 ENCOUNTER — Ambulatory Visit: Admitting: Nurse Practitioner

## 2024-07-27 DIAGNOSIS — E039 Hypothyroidism, unspecified: Secondary | ICD-10-CM

## 2024-07-27 NOTE — Progress Notes (Unsigned)
 Erroneous encounter
